# Patient Record
Sex: Male | Born: 2000 | Race: Black or African American | Hispanic: No | Marital: Single | Smoking: Never smoker
Health system: Southern US, Community
[De-identification: ages and names within clinical notes are randomized; demographics above are authoritative.]

## PROBLEM LIST (undated history)

## (undated) DIAGNOSIS — J353 Hypertrophy of tonsils with hypertrophy of adenoids: Secondary | ICD-10-CM

## (undated) DIAGNOSIS — G809 Cerebral palsy, unspecified: Secondary | ICD-10-CM

## (undated) DIAGNOSIS — E669 Obesity, unspecified: Secondary | ICD-10-CM

## (undated) DIAGNOSIS — J45909 Unspecified asthma, uncomplicated: Secondary | ICD-10-CM

## (undated) DIAGNOSIS — T7840XA Allergy, unspecified, initial encounter: Secondary | ICD-10-CM

## (undated) HISTORY — PX: ADENOIDECTOMY: SUR15

## (undated) HISTORY — PX: TONSILLECTOMY: SUR1361

## (undated) HISTORY — PX: LEG TENDON SURGERY: SHX1004

---
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
## 2020-04-24 IMAGING — DX DG CHEST 1V PORT
1 series · 1 of 1 positions shown · non-contrast
Comparison: 05/26/2019

CLINICAL DATA: Pneumonia, shortness of breath

EXAM:
PORTABLE CHEST 1 VIEW

[chest ap]
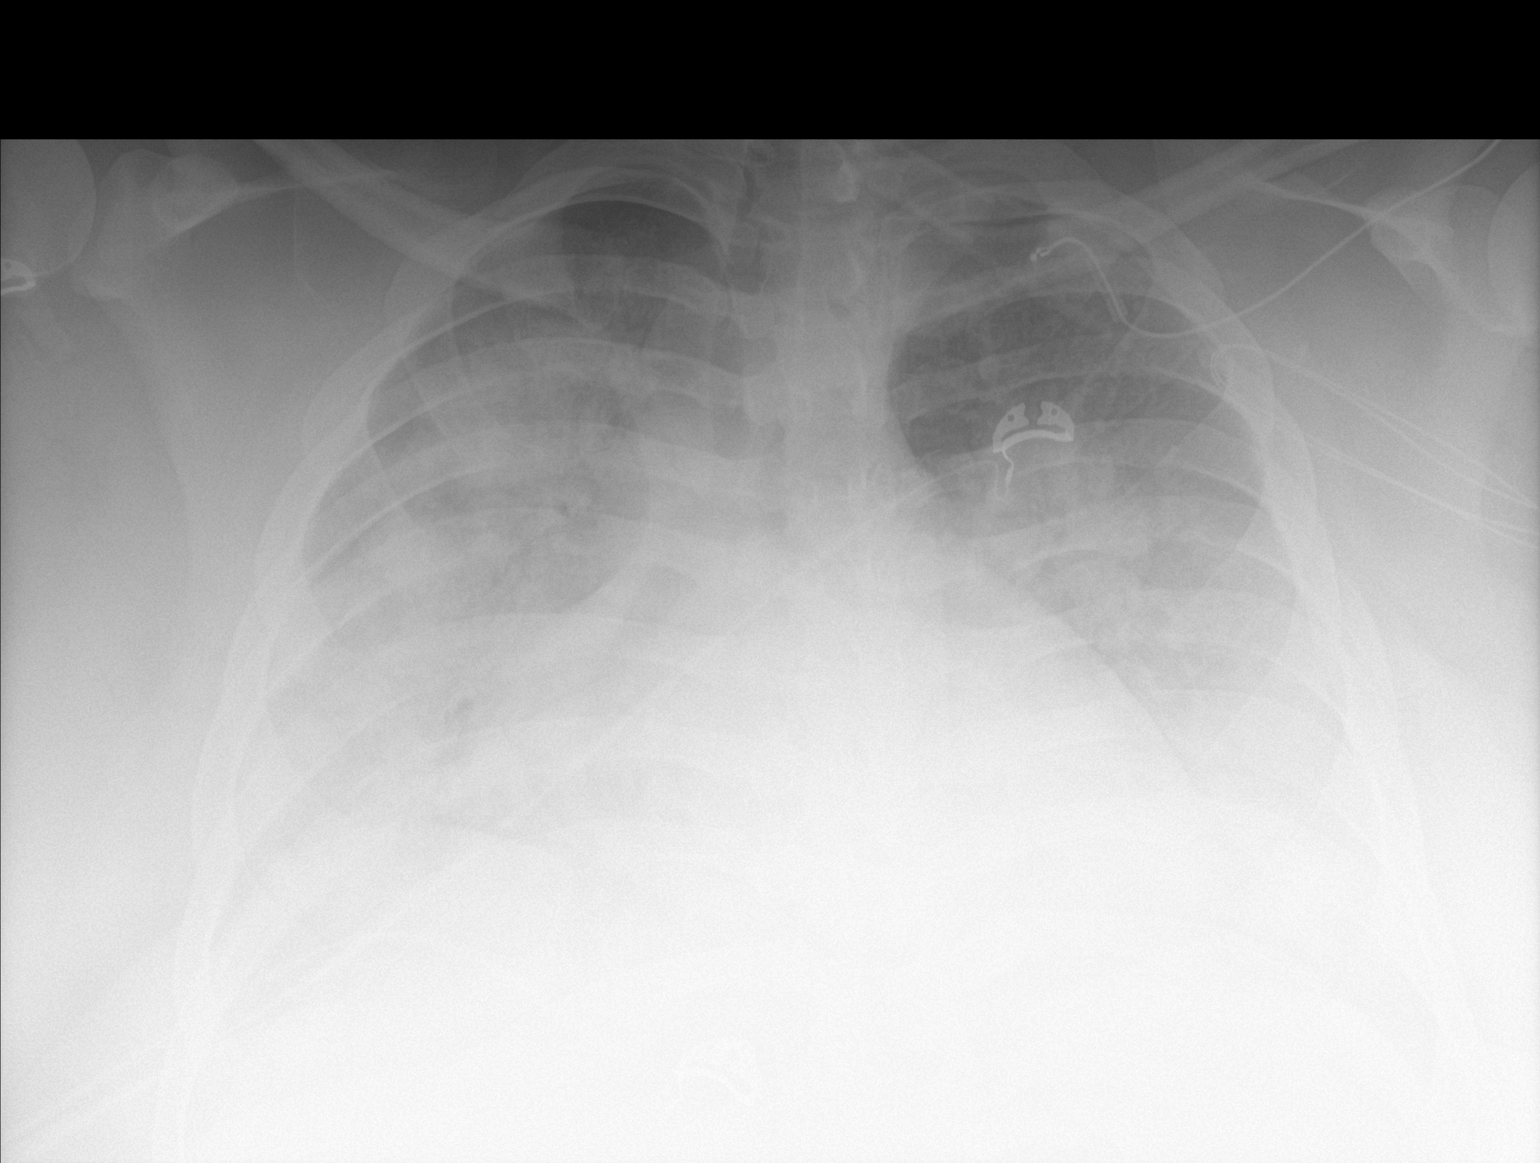

[1 of 1 positions shown; findings below may reference images not displayed]

FINDINGS: Bilateral interstitial and alveolar airspace disease throughout the
lungs with a lower lobe predominance. Possible small right pleural
effusion. No left pleural effusion. No pneumothorax. Stable
cardiomegaly. No acute osseous abnormality.
IMPRESSION: Extensive bilateral airspace disease worsening on the left most
consistent with multilobar pneumonia.

## 2022-04-19 NOTE — Telephone Encounter (Signed)
Open in error
